# Patient Record
Sex: Female | Born: 1985 | Race: Black or African American | Hispanic: No | Marital: Single | Smoking: Never smoker
Health system: Southern US, Community
[De-identification: ages and names within clinical notes are randomized; demographics above are authoritative.]

## PROBLEM LIST (undated history)

## (undated) DIAGNOSIS — G43909 Migraine, unspecified, not intractable, without status migrainosus: Secondary | ICD-10-CM

## (undated) DIAGNOSIS — J189 Pneumonia, unspecified organism: Secondary | ICD-10-CM

## (undated) HISTORY — PX: NO PAST SURGERIES: SHX2092

## (undated) HISTORY — DX: Migraine, unspecified, not intractable, without status migrainosus: G43.909

---

## 2007-04-13 ENCOUNTER — Emergency Department (HOSPITAL_COMMUNITY): Admission: EM | Admit: 2007-04-13 | Discharge: 2007-04-13 | Payer: Self-pay | Admitting: Emergency Medicine

## 2011-07-03 LAB — URINALYSIS, ROUTINE W REFLEX MICROSCOPIC
Bilirubin Urine: NEGATIVE
Glucose, UA: NEGATIVE
Nitrite: NEGATIVE
Specific Gravity, Urine: >1.03 — ABNORMAL HIGH
pH: 6

## 2011-07-03 LAB — URINE MICROSCOPIC-ADD ON

## 2011-07-03 LAB — PREGNANCY, URINE: Preg Test, Ur: NEGATIVE

## 2014-11-30 ENCOUNTER — Emergency Department (HOSPITAL_COMMUNITY)
Admission: EM | Admit: 2014-11-30 | Discharge: 2014-11-30 | Disposition: A | Discharge status: Home / Self Care | Payer: Self-pay | Attending: Emergency Medicine | Admitting: Emergency Medicine

## 2014-11-30 ENCOUNTER — Emergency Department (HOSPITAL_COMMUNITY): Payer: Self-pay

## 2014-11-30 ENCOUNTER — Encounter (HOSPITAL_COMMUNITY): Payer: Self-pay | Admitting: *Deleted

## 2014-11-30 DIAGNOSIS — Z880 Allergy status to penicillin: Secondary | ICD-10-CM | POA: Insufficient documentation

## 2014-11-30 DIAGNOSIS — M791 Myalgia, unspecified site: Secondary | ICD-10-CM

## 2014-11-30 DIAGNOSIS — R1084 Generalized abdominal pain: Secondary | ICD-10-CM | POA: Insufficient documentation

## 2014-11-30 DIAGNOSIS — J159 Unspecified bacterial pneumonia: Secondary | ICD-10-CM | POA: Insufficient documentation

## 2014-11-30 DIAGNOSIS — Z79899 Other long term (current) drug therapy: Secondary | ICD-10-CM | POA: Insufficient documentation

## 2014-11-30 DIAGNOSIS — J189 Pneumonia, unspecified organism: Secondary | ICD-10-CM

## 2014-11-30 DIAGNOSIS — Z3202 Encounter for pregnancy test, result negative: Secondary | ICD-10-CM | POA: Insufficient documentation

## 2014-11-30 LAB — BASIC METABOLIC PANEL
ANION GAP: 9 (ref 5–15)
BUN: 7 mg/dL (ref 6–23)
CALCIUM: 9.1 mg/dL (ref 8.4–10.5)
CO2: 25 mmol/L (ref 19–32)
CREATININE: 0.68 mg/dL (ref 0.50–1.10)
Chloride: 103 mmol/L (ref 96–112)
Glucose, Bld: 97 mg/dL (ref 70–99)
Potassium: 3.9 mmol/L (ref 3.5–5.1)
SODIUM: 137 mmol/L (ref 135–145)

## 2014-11-30 LAB — URINALYSIS, ROUTINE W REFLEX MICROSCOPIC
BILIRUBIN URINE: NEGATIVE
Glucose, UA: NEGATIVE mg/dL
HGB URINE DIPSTICK: NEGATIVE
Ketones, ur: NEGATIVE mg/dL
Leukocytes, UA: NEGATIVE
Nitrite: NEGATIVE
PROTEIN: NEGATIVE mg/dL
SPECIFIC GRAVITY, URINE: 1.012 (ref 1.005–1.030)
UROBILINOGEN UA: 0.2 mg/dL (ref 0.0–1.0)
pH: 6.5 (ref 5.0–8.0)

## 2014-11-30 LAB — CBC WITH DIFFERENTIAL/PLATELET
Basophils Absolute: 0 10*3/uL (ref 0.0–0.1)
Basophils Relative: 0 % (ref 0–1)
EOS PCT: 1 % (ref 0–5)
Eosinophils Absolute: 0.1 10*3/uL (ref 0.0–0.7)
HCT: 36.6 % (ref 36.0–46.0)
Hemoglobin: 12.5 g/dL (ref 12.0–15.0)
LYMPHS ABS: 0.6 10*3/uL — AB (ref 0.7–4.0)
LYMPHS PCT: 4 % — AB (ref 12–46)
MCH: 31.1 pg (ref 26.0–34.0)
MCHC: 34.2 g/dL (ref 30.0–36.0)
MCV: 91 fL (ref 78.0–100.0)
MONOS PCT: 4 % (ref 3–12)
Monocytes Absolute: 0.6 10*3/uL (ref 0.1–1.0)
NEUTROS PCT: 91 % — AB (ref 43–77)
Neutro Abs: 13 10*3/uL — ABNORMAL HIGH (ref 1.7–7.7)
PLATELETS: 319 10*3/uL (ref 150–400)
RBC: 4.02 MIL/uL (ref 3.87–5.11)
RDW: 12.9 % (ref 11.5–15.5)
WBC: 14.3 10*3/uL — AB (ref 4.0–10.5)

## 2014-11-30 LAB — CK: CK TOTAL: 91 U/L (ref 7–177)

## 2014-11-30 LAB — PREGNANCY, URINE: PREG TEST UR: NEGATIVE

## 2014-11-30 MED ORDER — LEVOFLOXACIN 500 MG PO TABS
500.0000 mg | ORAL_TABLET | Freq: Once | ORAL | Status: AC
  Administered 2014-11-30: 500 mg via ORAL
  Filled 2014-11-30: qty 1

## 2014-11-30 MED ORDER — LEVOFLOXACIN 500 MG PO TABS: 500.0000 mg | ORAL_TABLET | Freq: Every day | ORAL | Status: AC

## 2014-11-30 MED ORDER — KETOROLAC TROMETHAMINE 30 MG/ML IJ SOLN
15.0000 mg | Freq: Once | INTRAMUSCULAR | Status: AC
  Administered 2014-11-30: 15 mg via INTRAVENOUS
  Filled 2014-11-30: qty 1

## 2014-11-30 MED ORDER — LEVOFLOXACIN IN D5W 500 MG/100ML IV SOLN
500.0000 mg | Freq: Once | INTRAVENOUS | Status: DC
  Administered 2014-11-30: 500 mg via INTRAVENOUS
  Filled 2014-11-30: qty 100

## 2014-11-30 MED ORDER — SODIUM CHLORIDE 0.9 % IV BOLUS (SEPSIS)
1000.0000 mL | Freq: Once | INTRAVENOUS | Status: AC
  Administered 2014-11-30: 1000 mL via INTRAVENOUS

## 2014-11-30 NOTE — ED Provider Notes (Signed)
Medical Screening Exam:  Pt with generalized body aches x weeks, nausea, urinary frequency, abnormal menstrual period (short and light) Feb 28-29.  Sweating/chills, cough, nasal congestion began yesterday.    Patient is A&Ox4, NAD, RRR with murmur, lungs CTAB, abd soft, tender throughout lower abdomen without guarding or rebound.   Pt stable to move out of fast track into main ED.  Labs, UA, upreg ordered.    Trixie Dredgemily Filemon Breton, PA-C 11/30/14 [id number]  Gerhard Munchobert Lockwood, MD 11/30/14 [phone]

## 2014-11-30 NOTE — ED Notes (Signed)
Pt reporting burning at the IV site while antibiotic being infused. MD made aware and reports we will do PO antibiotics and continue with discharge.

## 2014-11-30 NOTE — Discharge Instructions (Signed)
As discussed, it is very important to take all medication as directed, get plenty of rest, drink plenty of fluids, and be sure to follow-up with your physician.  Return here for concerning changes in your condition.

## 2014-11-30 NOTE — ED Notes (Signed)
Pt reports a 3 day Hx of muscle pain generalized with reported fever of 99 at home. Pt denies sore throat.

## 2014-11-30 NOTE — ED Provider Notes (Addendum)
CSN: [id number]     Arrival date & time 11/30/14  4098 History   First MD Initiated Contact with Patient 11/30/14 [phone]     Chief Complaint  Patient presents with  . Influenza     HPI  Patient presents with concern of cough, congestion, myalgia, fatigue.  Symptoms began about 2 weeks ago.  Initially there was myalgia, fatigue, polyuria. Over the past 2 days patient has developed chills, cough, congestion. No fever, confusion, disorientation. No nausea, vomiting, diarrhea. No dysuria. No focal pain or No sore throat, no dysphagia, no headache. No relief with OTC medication.   History reviewed. No pertinent past medical history. History reviewed. No pertinent past surgical history. History reviewed. No pertinent family history. History  Substance Use Topics  . Smoking status: Never Smoker   . Smokeless tobacco: Never Used  . Alcohol Use: No   OB History    No data available     Review of Systems  Constitutional:       Per HPI, otherwise negative  HENT:       Per HPI, otherwise negative  Respiratory:       Per HPI, otherwise negative  Cardiovascular:       Per HPI, otherwise negative  Gastrointestinal: Negative for vomiting.  Endocrine:       Negative aside from HPI  Genitourinary:       Neg aside from HPI   Musculoskeletal:       Per HPI, otherwise negative  Skin: Negative.   Neurological: Negative for syncope.      Allergies  Other and Penicillins  Home Medications   Prior to Admission medications   Medication Sig Start Date End Date Taking? Authorizing Provider  acetaminophen (TYLENOL) 650 MG CR tablet Take 650 mg by mouth every 8 (eight) hours as needed for fever.   Yes Historical Provider, MD  Multiple Vitamins-Minerals (WOMENS MULTI VITAMIN & MINERAL PO) Take 1 tablet by mouth daily.   Yes Historical Provider, MD  Pseudoephedrine HCl (NASAL DECONGESTANT PO) Take 1 tablet by mouth daily as needed (cold, congestion).   Yes Historical Provider, MD    BP 119/56 mmHg  Pulse 82  Temp(Src) 98.3 F (36.8 C) (Oral)  Resp 18  SpO2 100%  LMP 11/15/2014 Physical Exam  Constitutional: She is oriented to person, place, and time. She appears well-developed and well-nourished. No distress.  HENT:  Head: Normocephalic and atraumatic.  Eyes: Conjunctivae and EOM are normal.  Cardiovascular: Normal rate and regular rhythm.   Pulmonary/Chest: Effort normal and breath sounds normal. No stridor. No respiratory distress.  Abdominal: She exhibits no distension.  Minimal diffuse tenderness, without focal pain  Musculoskeletal: She exhibits no edema.  Neurological: She is alert and oriented to person, place, and time. No cranial nerve deficit.  Skin: Skin is warm and dry.  Psychiatric: She has a normal mood and affect.  Nursing note and vitals reviewed.   ED Course  Procedures (including critical care time) Labs Review Labs Reviewed  CBC WITH DIFFERENTIAL/PLATELET - Abnormal; Notable for the following:    WBC 14.3 (*)    Neutrophils Relative % 91 (*)    Neutro Abs 13.0 (*)    Lymphocytes Relative 4 (*)    Lymphs Abs 0.6 (*)    All other components within normal limits  BASIC METABOLIC PANEL  URINALYSIS, ROUTINE W REFLEX MICROSCOPIC  PREGNANCY, URINE  CK    Imaging Review Dg Chest 2 View  11/30/2014   CLINICAL DATA:  Fever and  cough.  EXAM: CHEST  2 VIEW  COMPARISON:  05/25/2011  FINDINGS: Questionable reticular nodular airspace opacity in the left perihilar lung, only visible in the frontal projection. No edema, effusion, or pneumothorax. Normal heart size and aortic contours.  IMPRESSION: Possible bronchopneumonia on the left.   Electronically Signed   By: Marnee SpringJonathon  Watts M.D.   On: 11/30/2014 10:55   On repeat exam the patient is awake and alert, in no distress. I made her aware of the evidence for pneumonia.  12:45 PM Patient had arm burning w IV Levaquin. PO ordered MDM   Final diagnoses:  Myalgia   pneumonia  Patient  presents with ongoing respiratory infection, now with increasing myalgia, fever. Patient is awake and alert, hemodynamically stable. No evidence from when coronary ischemia, or sepsis. However, there is evidence for pneumonia. Patient was started on antibiotics, tolerated the first dose well, was discharged in stable condition with a course of medication for home.     Gerhard Munchobert Caylea Foronda, MD 11/30/14 1156  Gerhard Munchobert Antavia Tandy, MD 11/30/14 [phone]

## 2014-11-30 NOTE — ED Notes (Signed)
CK ADDED TO BLOOD IN LAB.

## 2015-02-19 ENCOUNTER — Emergency Department (HOSPITAL_COMMUNITY): Payer: No Typology Code available for payment source

## 2015-02-19 ENCOUNTER — Emergency Department (HOSPITAL_COMMUNITY)
Admission: EM | Admit: 2015-02-19 | Discharge: 2015-02-19 | Disposition: A | Discharge status: Home / Self Care | Payer: No Typology Code available for payment source | Attending: Emergency Medicine | Admitting: Emergency Medicine

## 2015-02-19 ENCOUNTER — Encounter (HOSPITAL_COMMUNITY): Payer: Self-pay | Admitting: Emergency Medicine

## 2015-02-19 DIAGNOSIS — S199XXA Unspecified injury of neck, initial encounter: Secondary | ICD-10-CM | POA: Insufficient documentation

## 2015-02-19 DIAGNOSIS — Y9389 Activity, other specified: Secondary | ICD-10-CM | POA: Diagnosis not present

## 2015-02-19 DIAGNOSIS — Z880 Allergy status to penicillin: Secondary | ICD-10-CM | POA: Insufficient documentation

## 2015-02-19 DIAGNOSIS — S3992XA Unspecified injury of lower back, initial encounter: Secondary | ICD-10-CM | POA: Insufficient documentation

## 2015-02-19 DIAGNOSIS — T148 Other injury of unspecified body region: Secondary | ICD-10-CM | POA: Diagnosis not present

## 2015-02-19 DIAGNOSIS — S8992XA Unspecified injury of left lower leg, initial encounter: Secondary | ICD-10-CM | POA: Insufficient documentation

## 2015-02-19 DIAGNOSIS — S8991XA Unspecified injury of right lower leg, initial encounter: Secondary | ICD-10-CM | POA: Insufficient documentation

## 2015-02-19 DIAGNOSIS — Y92410 Unspecified street and highway as the place of occurrence of the external cause: Secondary | ICD-10-CM | POA: Insufficient documentation

## 2015-02-19 DIAGNOSIS — Y998 Other external cause status: Secondary | ICD-10-CM | POA: Diagnosis not present

## 2015-02-19 MED ORDER — CYCLOBENZAPRINE HCL 5 MG PO TABS: 5.0000 mg | ORAL_TABLET | Freq: Three times a day (TID) | ORAL | Status: DC | PRN

## 2015-02-19 MED ORDER — NAPROXEN 500 MG PO TABS: 500.0000 mg | ORAL_TABLET | Freq: Two times a day (BID) | ORAL | Status: DC
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
## 2015-02-19 NOTE — ED Notes (Signed)
Dr.Knapp at bedside  

## 2015-02-19 NOTE — Discharge Instructions (Signed)
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
## 2015-07-10 ENCOUNTER — Encounter (HOSPITAL_COMMUNITY): Payer: Self-pay | Admitting: *Deleted

## 2015-07-10 ENCOUNTER — Emergency Department (HOSPITAL_COMMUNITY)
Admission: EM | Admit: 2015-07-10 | Discharge: 2015-07-10 | Disposition: A | Discharge status: Home / Self Care | Payer: BLUE CROSS/BLUE SHIELD | Attending: Emergency Medicine | Admitting: Emergency Medicine

## 2015-07-10 ENCOUNTER — Emergency Department (HOSPITAL_COMMUNITY): Payer: BLUE CROSS/BLUE SHIELD

## 2015-07-10 DIAGNOSIS — Y9389 Activity, other specified: Secondary | ICD-10-CM | POA: Diagnosis not present

## 2015-07-10 DIAGNOSIS — Y998 Other external cause status: Secondary | ICD-10-CM | POA: Diagnosis not present

## 2015-07-10 DIAGNOSIS — Z91040 Latex allergy status: Secondary | ICD-10-CM | POA: Insufficient documentation

## 2015-07-10 DIAGNOSIS — Z3202 Encounter for pregnancy test, result negative: Secondary | ICD-10-CM | POA: Insufficient documentation

## 2015-07-10 DIAGNOSIS — Z880 Allergy status to penicillin: Secondary | ICD-10-CM | POA: Diagnosis not present

## 2015-07-10 DIAGNOSIS — S301XXA Contusion of abdominal wall, initial encounter: Secondary | ICD-10-CM | POA: Diagnosis not present

## 2015-07-10 DIAGNOSIS — S20212A Contusion of left front wall of thorax, initial encounter: Secondary | ICD-10-CM | POA: Insufficient documentation

## 2015-07-10 DIAGNOSIS — S40021A Contusion of right upper arm, initial encounter: Secondary | ICD-10-CM | POA: Insufficient documentation

## 2015-07-10 DIAGNOSIS — S2232XA Fracture of one rib, left side, initial encounter for closed fracture: Secondary | ICD-10-CM | POA: Diagnosis not present

## 2015-07-10 DIAGNOSIS — S40022A Contusion of left upper arm, initial encounter: Secondary | ICD-10-CM | POA: Insufficient documentation

## 2015-07-10 DIAGNOSIS — Y92008 Other place in unspecified non-institutional (private) residence as the place of occurrence of the external cause: Secondary | ICD-10-CM | POA: Diagnosis not present

## 2015-07-10 DIAGNOSIS — Z791 Long term (current) use of non-steroidal anti-inflammatories (NSAID): Secondary | ICD-10-CM | POA: Diagnosis not present

## 2015-07-10 DIAGNOSIS — S299XXA Unspecified injury of thorax, initial encounter: Secondary | ICD-10-CM | POA: Diagnosis present

## 2015-07-10 DIAGNOSIS — W01198A Fall on same level from slipping, tripping and stumbling with subsequent striking against other object, initial encounter: Secondary | ICD-10-CM | POA: Insufficient documentation

## 2015-07-10 LAB — PREGNANCY, URINE: PREG TEST UR: NEGATIVE

## 2015-07-10 LAB — URINALYSIS, ROUTINE W REFLEX MICROSCOPIC
Bilirubin Urine: NEGATIVE
GLUCOSE, UA: NEGATIVE mg/dL
Ketones, ur: 15 mg/dL — AB
Leukocytes, UA: NEGATIVE
Nitrite: NEGATIVE
Protein, ur: 30 mg/dL — AB
SPECIFIC GRAVITY, URINE: 1.01 (ref 1.005–1.030)
Urobilinogen, UA: 0.2 mg/dL (ref 0.0–1.0)
pH: 7 (ref 5.0–8.0)

## 2015-07-10 LAB — URINE MICROSCOPIC-ADD ON

## 2015-07-10 MED ORDER — IBUPROFEN 800 MG PO TABS
800.0000 mg | ORAL_TABLET | Freq: Once | ORAL | Status: AC
  Administered 2015-07-10: 800 mg via ORAL
  Filled 2015-07-10: qty 1

## 2015-07-10 MED ORDER — HYDROCODONE-ACETAMINOPHEN 5-325 MG PO TABS: 1.0000 | ORAL_TABLET | ORAL | Status: DC | PRN

## 2015-07-10 MED ORDER — IBUPROFEN 800 MG PO TABS: 800.0000 mg | ORAL_TABLET | Freq: Three times a day (TID) | ORAL | Status: DC

## 2015-07-10 NOTE — Discharge Instructions (Signed)
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
## 2015-10-07 ENCOUNTER — Other Ambulatory Visit (HOSPITAL_COMMUNITY): Payer: Self-pay | Admitting: Nurse Practitioner

## 2015-10-07 DIAGNOSIS — R1032 Left lower quadrant pain: Secondary | ICD-10-CM

## 2015-10-11 ENCOUNTER — Ambulatory Visit (HOSPITAL_COMMUNITY)
Admission: RE | Admit: 2015-10-11 | Discharge: 2015-10-11 | Disposition: A | Discharge status: Home / Self Care | Payer: BLUE CROSS/BLUE SHIELD | Source: Ambulatory Visit | Attending: Nurse Practitioner | Admitting: Nurse Practitioner

## 2015-10-11 ENCOUNTER — Encounter (HOSPITAL_COMMUNITY): Payer: Self-pay

## 2015-10-11 DIAGNOSIS — R1032 Left lower quadrant pain: Secondary | ICD-10-CM | POA: Diagnosis present

## 2015-10-11 DIAGNOSIS — N200 Calculus of kidney: Secondary | ICD-10-CM | POA: Diagnosis not present

## 2015-10-11 DIAGNOSIS — K760 Fatty (change of) liver, not elsewhere classified: Secondary | ICD-10-CM | POA: Insufficient documentation

## 2015-10-11 MED ORDER — IOHEXOL 300 MG/ML  SOLN
100.0000 mL | Freq: Once | INTRAMUSCULAR | Status: AC | PRN
  Administered 2015-10-11: 100 mL via INTRAVENOUS

## 2015-10-11 MED ORDER — DIPHENHYDRAMINE HCL 25 MG PO CAPS
ORAL_CAPSULE | ORAL | Status: AC
  Filled 2015-10-11: qty 2

## 2015-10-11 MED ORDER — DIPHENHYDRAMINE HCL 50 MG PO CAPS
50.0000 mg | ORAL_CAPSULE | Freq: Once | ORAL | Status: AC
  Administered 2015-10-11: 50 mg via ORAL
  Filled 2015-10-11: qty 1
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
## 2016-01-16 ENCOUNTER — Emergency Department (HOSPITAL_COMMUNITY)
Admission: EM | Admit: 2016-01-16 | Discharge: 2016-01-16 | Disposition: A | Discharge status: Home / Self Care | Payer: BLUE CROSS/BLUE SHIELD | Attending: Emergency Medicine | Admitting: Emergency Medicine

## 2016-01-16 ENCOUNTER — Encounter (HOSPITAL_COMMUNITY): Payer: Self-pay | Admitting: *Deleted

## 2016-01-16 ENCOUNTER — Emergency Department (HOSPITAL_COMMUNITY): Payer: BLUE CROSS/BLUE SHIELD

## 2016-01-16 DIAGNOSIS — S0083XA Contusion of other part of head, initial encounter: Secondary | ICD-10-CM | POA: Diagnosis not present

## 2016-01-16 DIAGNOSIS — S0990XA Unspecified injury of head, initial encounter: Secondary | ICD-10-CM

## 2016-01-16 DIAGNOSIS — T07XXXA Unspecified multiple injuries, initial encounter: Secondary | ICD-10-CM

## 2016-01-16 DIAGNOSIS — Y92009 Unspecified place in unspecified non-institutional (private) residence as the place of occurrence of the external cause: Secondary | ICD-10-CM | POA: Diagnosis not present

## 2016-01-16 DIAGNOSIS — Y9389 Activity, other specified: Secondary | ICD-10-CM | POA: Diagnosis not present

## 2016-01-16 DIAGNOSIS — Y999 Unspecified external cause status: Secondary | ICD-10-CM | POA: Insufficient documentation

## 2016-01-16 DIAGNOSIS — S0003XA Contusion of scalp, initial encounter: Secondary | ICD-10-CM | POA: Diagnosis not present

## 2016-01-16 LAB — I-STAT CHEM 8, ED
BUN: 10 mg/dL (ref 6–20)
Calcium, Ion: 1.14 mmol/L (ref 1.12–1.23)
Chloride: 102 mmol/L (ref 101–111)
Creatinine, Ser: 0.8 mg/dL (ref 0.44–1.00)
Glucose, Bld: 84 mg/dL (ref 65–99)
HEMATOCRIT: 40 % (ref 36.0–46.0)
HEMOGLOBIN: 13.6 g/dL (ref 12.0–15.0)
POTASSIUM: 3.6 mmol/L (ref 3.5–5.1)
Sodium: 139 mmol/L (ref 135–145)
TCO2: 23 mmol/L (ref 0–100)

## 2016-01-16 LAB — I-STAT BETA HCG BLOOD, ED (MC, WL, AP ONLY)

## 2016-01-16 MED ORDER — HYDROCODONE-ACETAMINOPHEN 5-325 MG PO TABS
2.0000 | ORAL_TABLET | Freq: Once | ORAL | Status: AC
  Administered 2016-01-16: 2 via ORAL
  Filled 2016-01-16: qty 2

## 2016-01-16 MED ORDER — HYDROCODONE-ACETAMINOPHEN 5-325 MG PO TABS: 2.0000 | ORAL_TABLET | ORAL | Status: DC | PRN

## 2016-01-16 MED ORDER — IBUPROFEN 800 MG PO TABS: 800.0000 mg | ORAL_TABLET | Freq: Three times a day (TID) | ORAL | Status: DC
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
## 2016-01-16 NOTE — Discharge Instructions (Signed)
Please obtain all of your results from medical records or have your doctors office obtain the results - share them with your doctor - you should be seen at your doctors office in the next 2 days. Call today to arrange your follow up. Take the medications as prescribed. Please review all of the medicines and only take them if you do not have an allergy to them. Please be aware that if you are taking birth control pills, taking other prescriptions, ESPECIALLY ANTIBIOTICS may make the birth control ineffective - if this is the case, either do not engage in sexual activity or use alternative methods of birth control such as condoms until you have finished the medicine and your family doctor says it is OK to restart them. If you are on a blood thinner such as COUMADIN, be aware that any other medicine that you take may cause the coumadin to either work too much, or not enough - you should have your coumadin level rechecked in next 7 days if this is the case.  ?  It is also a possibility that you have an allergic reaction to any of the medicines that you have been prescribed - Everybody reacts differently to medications and while MOST people have no trouble with most medicines, you may have a reaction such as nausea, vomiting, rash, swelling, shortness of breath. If this is the case, please stop taking the medicine immediately and contact your physician.  ?  You should return to the ER if you develop severe or worsening symptoms.    Concussion, Adult A concussion, or closed-head injury, is a brain injury caused by a direct blow to the head or by a quick and sudden movement (jolt) of the head or neck. Concussions are usually not life-threatening. Even so, the effects of a concussion can be serious. If you have had a concussion before, you are more likely to experience concussion-like symptoms after a direct blow to the head.  CAUSES  Direct blow to the head, such as from running into another player during a  soccer game, being hit in a fight, or hitting your head on a hard surface.  A jolt of the head or neck that causes the brain to move back and forth inside the skull, such as in a car crash. SIGNS AND SYMPTOMS The signs of a concussion can be hard to notice. Early on, they may be missed by you, family members, and health care providers. You may look fine but act or feel differently. Symptoms are usually temporary, but they may last for days, weeks, or even longer. Some symptoms may appear right away while others may not show up for hours or days. Every head injury is different. Symptoms include:  Mild to moderate headaches that will not go away.  A feeling of pressure inside your head.  Having more trouble than usual:  Learning or remembering things you have heard.  Answering questions.  Paying attention or concentrating.  Organizing daily tasks.  Making decisions and solving problems.  Slowness in thinking, acting or reacting, speaking, or reading.  Getting lost or being easily confused.  Feeling tired all the time or lacking energy (fatigued).  Feeling drowsy.  Sleep disturbances.  Sleeping more than usual.  Sleeping less than usual.  Trouble falling asleep.  Trouble sleeping (insomnia).  Loss of balance or feeling lightheaded or dizzy.  Nausea or vomiting.  Numbness or tingling.  Increased sensitivity to:  Sounds.  Lights.  Distractions.  Vision problems or eyes  that tire easily.  Diminished sense of taste or smell.  Ringing in the ears.  Mood changes such as feeling sad or anxious.  Becoming easily irritated or angry for little or no reason.  Lack of motivation.  Seeing or hearing things other people do not see or hear (hallucinations). DIAGNOSIS Your health care provider can usually diagnose a concussion based on a description of your injury and symptoms. He or she will ask whether you passed out (lost consciousness) and whether you are having  trouble remembering events that happened right before and during your injury. Your evaluation might include:  A brain scan to look for signs of injury to the brain. Even if the test shows no injury, you may still have a concussion.  Blood tests to be sure other problems are not present. TREATMENT  Concussions are usually treated in an emergency department, in urgent care, or at a clinic. You may need to stay in the hospital overnight for further treatment.  Tell your health care provider if you are taking any medicines, including prescription medicines, over-the-counter medicines, and natural remedies. Some medicines, such as blood thinners (anticoagulants) and aspirin, may increase the chance of complications. Also tell your health care provider whether you have had alcohol or are taking illegal drugs. This information may affect treatment.  Your health care provider will send you home with important instructions to follow.  How fast you will recover from a concussion depends on many factors. These factors include how severe your concussion is, what part of your brain was injured, your age, and how healthy you were before the concussion.  Most people with mild injuries recover fully. Recovery can take time. In general, recovery is slower in older persons. Also, persons who have had a concussion in the past or have other medical problems may find that it takes longer to recover from their current injury. HOME CARE INSTRUCTIONS General Instructions  Carefully follow the directions your health care provider gave you.  Only take over-the-counter or prescription medicines for pain, discomfort, or fever as directed by your health care provider.  Take only those medicines that your health care provider has approved.  Do not drink alcohol until your health care provider says you are well enough to do so. Alcohol and certain other drugs may slow your recovery and can put you at risk of further  injury.  If it is harder than usual to remember things, write them down.  If you are easily distracted, try to do one thing at a time. For example, do not try to watch TV while fixing dinner.  Talk with family members or close friends when making important decisions.  Keep all follow-up appointments. Repeated evaluation of your symptoms is recommended for your recovery.  Watch your symptoms and tell others to do the same. Complications sometimes occur after a concussion. Older adults with a brain injury may have a higher risk of serious complications, such as a blood clot on the brain.  Tell your teachers, school nurse, school counselor, coach, athletic trainer, or work Production designer, theatre/television/film about your injury, symptoms, and restrictions. Tell them about what you can or cannot do. They should watch for:  Increased problems with attention or concentration.  Increased difficulty remembering or learning new information.  Increased time needed to complete tasks or assignments.  Increased irritability or decreased ability to cope with stress.  Increased symptoms.  Rest. Rest helps the brain to heal. Make sure you:  Get plenty of sleep at night.  Avoid staying up late at night.  Keep the same bedtime hours on weekends and weekdays.  Rest during the day. Take daytime naps or rest breaks when you feel tired.  Limit activities that require a lot of thought or concentration. These include:  Doing homework or job-related work.  Watching TV.  Working on the computer.  Avoid any situation where there is potential for another head injury (football, hockey, soccer, basketball, martial arts, downhill snow sports and horseback riding). Your condition will get worse every time you experience a concussion. You should avoid these activities until you are evaluated by the appropriate follow-up health care providers. Returning To Your Regular Activities You will need to return to your normal activities slowly,  not all at once. You must give your body and brain enough time for recovery.  Do not return to sports or other athletic activities until your health care provider tells you it is safe to do so.  Ask your health care provider when you can drive, ride a bicycle, or operate heavy machinery. Your ability to react may be slower after a brain injury. Never do these activities if you are dizzy.  Ask your health care provider about when you can return to work or school. Preventing Another Concussion It is very important to avoid another brain injury, especially before you have recovered. In rare cases, another injury can lead to permanent brain damage, brain swelling, or death. The risk of this is greatest during the first 7-10 days after a head injury. Avoid injuries by:  Wearing a seat belt when riding in a car.  Drinking alcohol only in moderation.  Wearing a helmet when biking, skiing, skateboarding, skating, or doing similar activities.  Avoiding activities that could lead to a second concussion, such as contact or recreational sports, until your health care provider says it is okay.  Taking safety measures in your home.  Remove clutter and tripping hazards from floors and stairways.  Use grab bars in bathrooms and handrails by stairs.  Place non-slip mats on floors and in bathtubs.  Improve lighting in dim areas. SEEK MEDICAL CARE IF:  You have increased problems paying attention or concentrating.  You have increased difficulty remembering or learning new information.  You need more time to complete tasks or assignments than before.  You have increased irritability or decreased ability to cope with stress.  You have more symptoms than before. Seek medical care if you have any of the following symptoms for more than 2 weeks after your injury:  Lasting (chronic) headaches.  Dizziness or balance problems.  Nausea.  Vision problems.  Increased sensitivity to noise or  light.  Depression or mood swings.  Anxiety or irritability.  Memory problems.  Difficulty concentrating or paying attention.  Sleep problems.  Feeling tired all the time. SEEK IMMEDIATE MEDICAL CARE IF:  You have severe or worsening headaches. These may be a sign of a blood clot in the brain.  You have weakness (even if only in one hand, leg, or part of the face).  You have numbness.  You have decreased coordination.  You vomit repeatedly.  You have increased sleepiness.  One pupil is larger than the other.  You have convulsions.  You have slurred speech.  You have increased confusion. This may be a sign of a blood clot in the brain.  You have increased restlessness, agitation, or irritability.  You are unable to recognize people or places.  You have neck pain.  It is difficult  to wake you up.  You have unusual behavior changes.  You lose consciousness. MAKE SURE YOU:  Understand these instructions.  Will watch your condition.  Will get help right away if you are not doing well or get worse.   This information is not intended to replace advice given to you by your health care provider. Make sure you discuss any questions you have with your health care provider.   Document Released: 11/25/2003 Document Revised: 09/25/2014 Document Reviewed: 03/27/2013 Elsevier Interactive Patient Education [employer]

## 2016-01-16 NOTE — ED Notes (Signed)
Patient with no complaints at this time. Respirations even and unlabored. Skin warm/dry. Discharge instructions reviewed with patient at this time. Patient given opportunity to voice concerns/ask questions. Patient discharged at this time and left Emergency Department with steady gait.   
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
## 2017-11-03 ENCOUNTER — Emergency Department (HOSPITAL_COMMUNITY): Payer: Self-pay

## 2017-11-03 ENCOUNTER — Emergency Department (HOSPITAL_COMMUNITY)
Admission: EM | Admit: 2017-11-03 | Discharge: 2017-11-03 | Disposition: A | Discharge status: Home / Self Care | Payer: Self-pay | Attending: Emergency Medicine | Admitting: Emergency Medicine

## 2017-11-03 ENCOUNTER — Other Ambulatory Visit: Payer: Self-pay

## 2017-11-03 ENCOUNTER — Encounter (HOSPITAL_COMMUNITY): Payer: Self-pay | Admitting: *Deleted

## 2017-11-03 DIAGNOSIS — J111 Influenza due to unidentified influenza virus with other respiratory manifestations: Secondary | ICD-10-CM | POA: Insufficient documentation

## 2017-11-03 DIAGNOSIS — Z91040 Latex allergy status: Secondary | ICD-10-CM | POA: Insufficient documentation

## 2017-11-03 DIAGNOSIS — R69 Illness, unspecified: Secondary | ICD-10-CM

## 2017-11-03 HISTORY — DX: Pneumonia, unspecified organism: J18.9

## 2017-11-03 NOTE — ED Provider Notes (Signed)
[place] EMERGENCY DEPARTMENT Provider Note   CSN: [id number] Arrival date & time: 11/03/17  [id number]     History   Chief Complaint Chief Complaint  Patient presents with  . Cough    HPI Alexandra Shannon is a 32 y.o. female.  The history is provided by the patient.  Cough  This is a new problem. The current episode started 12 to 24 hours ago. The problem occurs every few minutes. The problem has been gradually worsening. The cough is productive of sputum. Associated symptoms include chills, sweats, sore throat, myalgias and shortness of breath. She has tried cough syrup for the symptoms. The treatment provided no relief. She is a smoker.   Reports onset of cough/sore throat/myalgias yesterday.  She also reports chills and waking up with sweats.  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  Past Medical History:  Diagnosis Date  . Pneumonia     There are no active problems to display for this patient.   History reviewed. No pertinent surgical history.  OB History    No data available       Home Medications    Prior to Admission medications   Medication Sig Start Date End Date Taking? Authorizing Provider  HYDROcodone-acetaminophen (NORCO/VICODIN) 5-325 MG tablet Take 2 tablets by mouth every 4 (four) hours as needed. 01/16/16   Eber HongMiller, Brian, MD  ibuprofen (ADVIL,MOTRIN) 800 MG tablet Take 1 tablet (800 mg total) by mouth 3 (three) times daily. 01/16/16   Eber HongMiller, Brian, MD    Family History History reviewed. No pertinent family history.  Social History Social History   Tobacco Use  . Smoking status: Never Smoker  . Smokeless tobacco: Never Used  Substance Use Topics  . Alcohol use: No    Frequency: Never  . Drug use: No     Allergies   Contrast media [iodinated diagnostic agents]; Other; Penicillins; and Latex   Review of Systems Review of Systems  Constitutional:  Positive for chills.  HENT: Positive for sore throat.   Respiratory: Positive for cough and shortness of breath.   Gastrointestinal: Negative for vomiting.  Musculoskeletal: Positive for myalgias.  All other systems reviewed and are negative.    Physical Exam Updated Vital Signs BP 127/80 (BP Location: Right Arm)   Pulse 78   Temp 98.4 F (36.9 C) (Oral)   Resp 18   Ht 1.575 m (5\' 2" )   Wt 73.9 kg (163 lb)   LMP 09/08/2017   SpO2 100%   BMI 29.81 kg/m   Physical Exam CONSTITUTIONAL: Well developed/well nourished HEAD: Normocephalic/atraumatic EYES: EOMI/PERRL ENMT: Mucous membranes moist, no erythema or exudates. NECK: supple no meningeal signs SPINE/BACK:entire spine nontender CV: S1/S2 noted, no murmurs/rubs/gallops noted LUNGS: Lungs are clear to auscultation bilaterally, no apparent distress ABDOMEN: soft, nontender NEURO: Pt is awake/alert/appropriate, moves all extremitiesx4.  No facial droop.   EXTREMITIES: pulses normal/equal, full ROM SKIN: warm, color normal PSYCH: no abnormalities of mood noted, alert and oriented to situation   ED Treatments / Results  Labs (all labs ordered are listed, but only abnormal results are displayed) Labs Reviewed - No data to display  EKG  EKG Interpretation None       Radiology Dg Chest 2 View  Result Date: 11/03/2017 CLINICAL DATA:  Cough. EXAM: CHEST  2 VIEW COMPARISON:  07/10/2015 FINDINGS: The cardiomediastinal contours are normal. Mild bronchial thickening. Pulmonary vasculature is normal. No  consolidation, pleural effusion, or pneumothorax. No acute osseous abnormalities are seen. IMPRESSION: Mild bronchial thickening. Electronically Signed   By: Rubye Oaks M.D.   On: 11/03/2017 05:43    Procedures Procedures (including critical care time)  Medications Ordered in ED Medications - No data to display   Initial Impression / Assessment and Plan / ED Course  I have reviewed the triage vital signs and the  nursing notes.  Pertinent imaging results that were available during my care of the patient were reviewed by me and considered in my medical decision making (see chart for details).     Patient With probable influenza.  Her vital signs are appropriate, no hypoxia no distress noted.  Her chest x-ray is negative.  I have offered her Tamiflu, and after discussion she will decline.  This seems reasonable as she is very low risk for any complications.  We discussed strict return precautions  Final Clinical Impressions(s) / ED Diagnoses   Final diagnoses:  Influenza-like illness    ED Discharge Orders    None       Zadie Rhine, MD 11/03/17 [phone]

## 2017-11-03 NOTE — ED Triage Notes (Addendum)
Pt reports dry cough, chest soreness, scratchy throat, chills, and body aches since yesterday morning. Pt states she feels like she did when she had pneumonia in 2016.

## 2018-09-10 IMAGING — DX DG CHEST 2V
2 series · 2 of 2 positions shown · non-contrast
Comparison: 07/10/2015

CLINICAL DATA: Cough.

EXAM:
CHEST  2 VIEW

[chest pa]
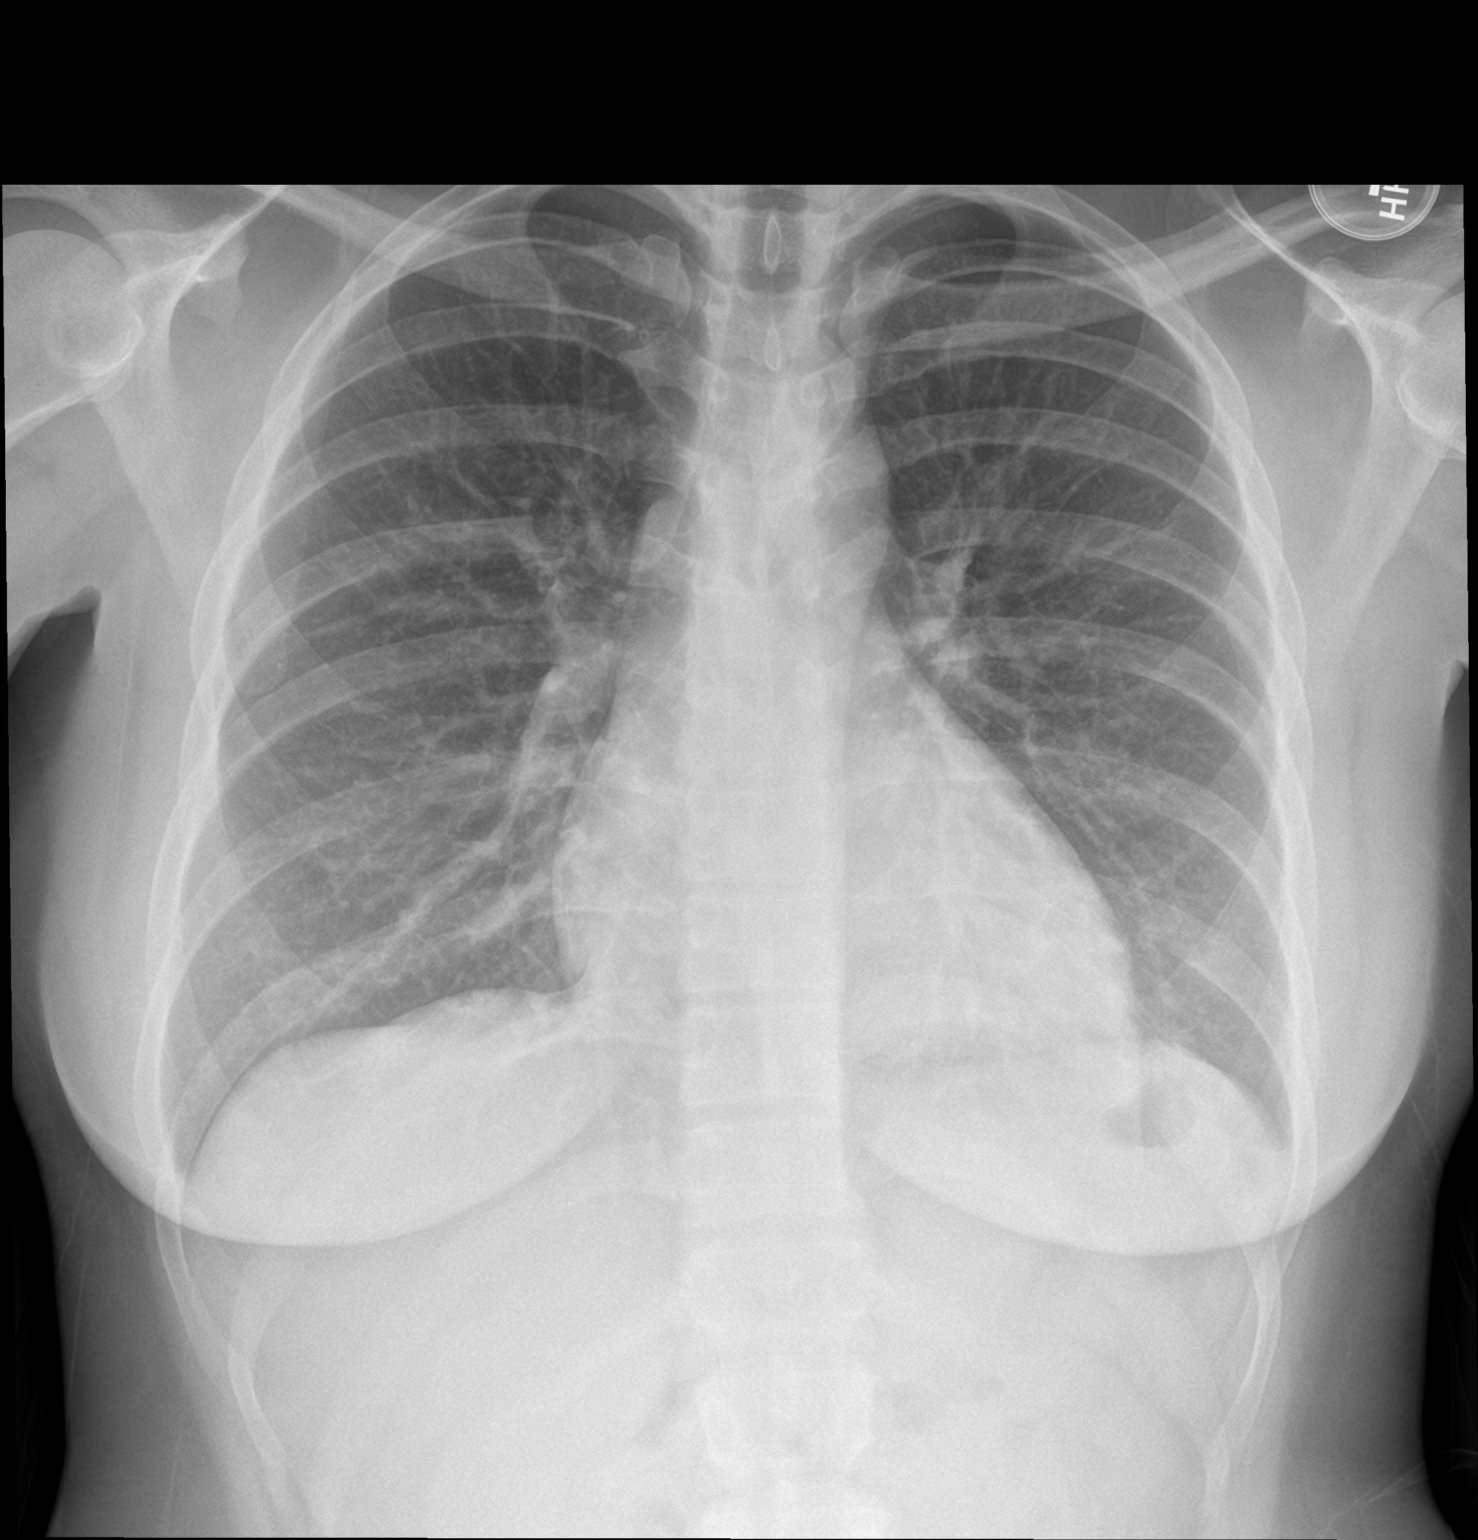

[chest lat]
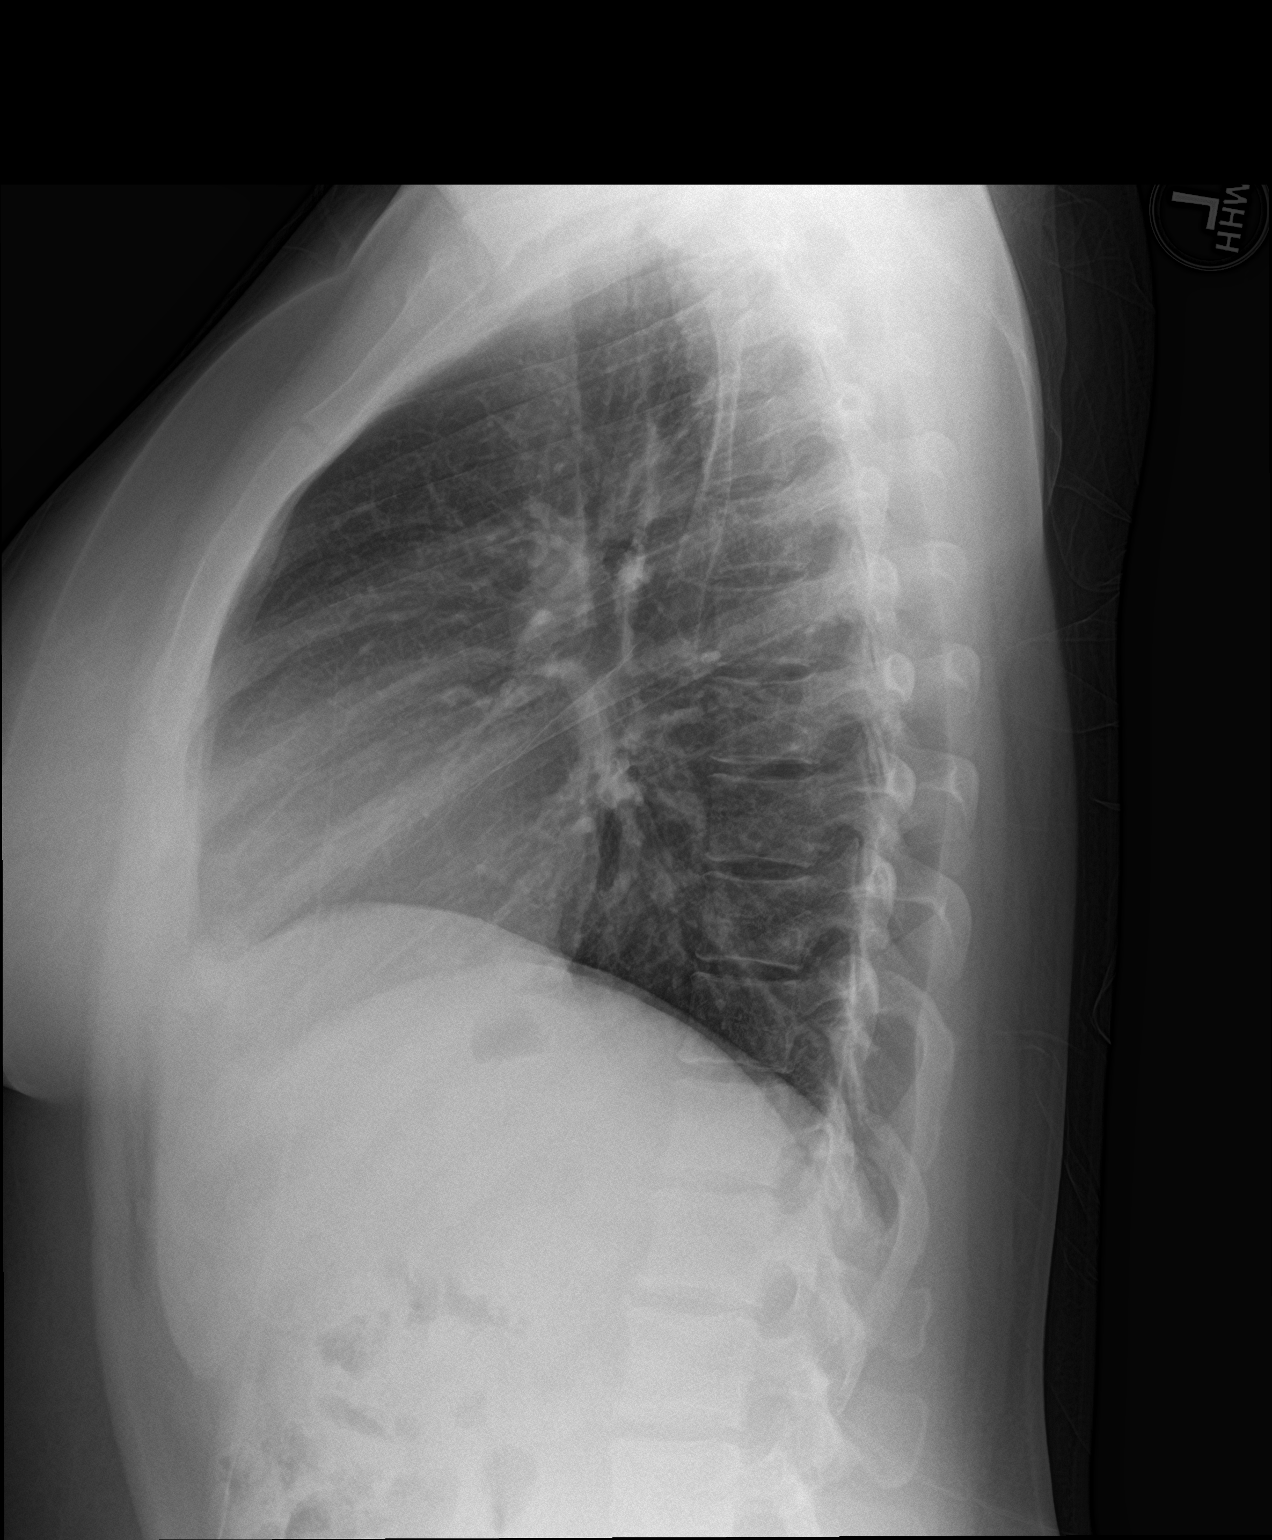

[2 of 2 positions shown; findings below may reference images not displayed]

FINDINGS: The cardiomediastinal contours are normal. Mild bronchial
thickening. Pulmonary vasculature is normal. No consolidation,
pleural effusion, or pneumothorax. No acute osseous abnormalities
are seen.
IMPRESSION: Mild bronchial thickening.

## 2020-01-12 ENCOUNTER — Encounter: Payer: Self-pay | Admitting: Neurology

## 2020-01-12 ENCOUNTER — Ambulatory Visit: Payer: 59 | Admitting: Neurology

## 2020-01-12 ENCOUNTER — Other Ambulatory Visit: Payer: Self-pay

## 2020-01-12 VITALS — BP 145/92 | HR 77 | Temp 97.5°F | Ht 63.0 in | Wt 202.8 lb

## 2020-01-12 DIAGNOSIS — G43709 Chronic migraine without aura, not intractable, without status migrainosus: Secondary | ICD-10-CM | POA: Diagnosis not present

## 2020-01-12 DIAGNOSIS — IMO0002 Reserved for concepts with insufficient information to code with codable children: Secondary | ICD-10-CM

## 2020-01-12 MED ORDER — ONDANSETRON 4 MG PO TBDP: 4.0000 mg | ORAL_TABLET | Freq: Three times a day (TID) | ORAL | 6 refills | Status: AC | PRN

## 2020-01-12 MED ORDER — TOPIRAMATE 50 MG PO TABS: 50.0000 mg | ORAL_TABLET | Freq: Two times a day (BID) | ORAL | 4 refills | Status: DC

## 2020-01-12 MED ORDER — RIZATRIPTAN BENZOATE 5 MG PO TBDP
5.0000 mg | ORAL_TABLET | ORAL | 12 refills | Status: AC | PRN
Start: 2020-01-12 — End: ?

## 2020-01-12 NOTE — Progress Notes (Signed)
PATIENT: Alexandra Shannon DOB: Jan 17, 1986  Chief Complaint  Patient presents with  . New Patient (Initial Visit)    EMG room 4, alone. Paper referral from Orvilla Cornwall (PCP) for migraines.   [name] PCP    [facility] Cornwall  . Migraine    Had migraines as a teen and then went away. Recently came back with N/V and auras. She was placed on topamax 25mg  po BID about a month ago which has helped.      HISTORICAL  Alexandra Shannon is a 34 year old female, seen in request by her primary care physician Dr. [id number] for evaluation of chronic migraine headache, initial evaluation was on January 12, 2020.  I have reviewed and summarized the referring note from the referring physician.  She reported long history of migraine since teenager, her typical migraine are lateralized, usually on the left side, preceded by left eye twitching, dense buildup of severe pounding headache with associated light noise sensitivity, nauseous, lasting 4 to 5 hours, if she take Excedrin Migraine earlier, it can help her migraine, sleep generally helps too.  Since beginning of 2021, she experienced increased frequency of headache, from her usual frequency of 1 to twice each months to 2-3 times each week, severe, she has been taking up to 5 tablets of Excedrin Migraine on a daily basis, she was seen by her primary care November 27, 2019, was started on Topamax 25 mg twice daily, she tolerated the medicine well, which has helped her migraine, she has been headache free for 2 weeks.  Before that, she never tried preventive medications, common trigger for her migraines are weather change, menstruation, stress, sleep deprivation, hungry, strong smells, bright light, certain seafood   REVIEW OF SYSTEMS: Full 14 system review of systems performed and notable only for as above All other review of systems were negative.  ALLERGIES: Allergies  Allergen Reactions  . Contrast Media [Iodinated Diagnostic Agents] Hives and  Itching  . Other Anaphylaxis    Bar -b -q sauce  . Penicillins Anaphylaxis    Swelling and hives  . Latex Itching    HOME MEDICATIONS: Current Outpatient Medications  Medication Sig Dispense Refill  . topiramate (TOPAMAX) 25 MG tablet Take 25 mg by mouth 2 (two) times daily.    . ondansetron (ZOFRAN ODT) 4 MG disintegrating tablet Take 1 tablet (4 mg total) by mouth every 8 (eight) hours as needed. 20 tablet 6  . rizatriptan (MAXALT-MLT) 5 MG disintegrating tablet Take 1 tablet (5 mg total) by mouth as needed. May repeat in 2 hours if needed 15 tablet 12  . topiramate (TOPAMAX) 50 MG tablet Take 1 tablet (50 mg total) by mouth 2 (two) times daily. 180 tablet 4   No current facility-administered medications for this visit.    PAST MEDICAL HISTORY: Past Medical History:  Diagnosis Date  . Migraine   . Pneumonia     PAST SURGICAL HISTORY: Past Surgical History:  Procedure Laterality Date  . NO PAST SURGERIES      FAMILY HISTORY: History reviewed. No pertinent family history.  SOCIAL HISTORY: Social History   Socioeconomic History  . Marital status: Single    Spouse name: Not on file  . Number of children: 0  . Years of education: 91  . Highest education level: Not on file  Occupational History  . Occupation: 18   Tobacco Use  . Smoking status: Never Smoker  . Smokeless tobacco: Never Used  Substance and Sexual Activity  . Alcohol  use: Yes    Comment: socially  . Drug use: No  . Sexual activity: Not on file  Other Topics Concern  . Not on file  Social History Narrative   Right handed    Caffeine use: coffee every morning   Lives with mother   Social Determinants of Health   Financial Resource Strain:   . Difficulty of Paying Living Expenses:   Food Insecurity:   . Worried About Charity fundraiser in the Last Year:   . Arboriculturist in the Last Year:   Transportation Needs:   . Film/video editor (Medical):   [name] Lack of  Transportation (Non-Medical):   Physical Activity:   . Days of Exercise per Week:   . Minutes of Exercise per Session:   Stress:   . Feeling of Stress :   Social Connections:   . Frequency of Communication with Friends and Family:   . Frequency of Social Gatherings with Friends and Family:   . Attends Religious Services:   . Active Member of Clubs or Organizations:   . Attends Archivist Meetings:   [name] Marital Status:   Intimate Partner Violence:   . Fear of Current or Ex-Partner:   . Emotionally Abused:   [name] Physically Abused:   . Sexually Abused:      PHYSICAL EXAM   Vitals:   01/12/20 0729  BP: (!) 145/92  Pulse: 77  Temp: (!) 97.5 F (36.4 C)  Weight: 202 lb 12.8 oz (92 kg)  Height: 5\' 3"  (1.6 m)    Not recorded      Body mass index is 35.92 kg/m.  PHYSICAL EXAMNIATION:  Gen: NAD, conversant, well nourised, well groomed                     Cardiovascular: Regular rate rhythm, no peripheral edema, warm, nontender. Eyes: Conjunctivae clear without exudates or hemorrhage Neck: Supple, no carotid bruits. Pulmonary: Clear to auscultation bilaterally   NEUROLOGICAL EXAM:  MENTAL STATUS: Speech:    Speech is normal; fluent and spontaneous with normal comprehension.  Cognition:     Orientation to time, place and person     Normal recent and remote memory     Normal Attention span and concentration     Normal Language, naming, repeating,spontaneous speech     Fund of knowledge   CRANIAL NERVES: CN II: Visual fields are full to confrontation. Pupils are round equal and briskly reactive to light. CN III, IV, VI: extraocular movement are normal. No ptosis. CN V: Facial sensation is intact to light touch CN VII: Face is symmetric with normal eye closure  CN VIII: Hearing is normal to causal conversation. CN IX, X: Phonation is normal. CN XI: Head turning and shoulder shrug are intact  MOTOR: There is no pronator drift of out-stretched arms. Muscle  bulk and tone are normal. Muscle strength is normal.  REFLEXES: Reflexes are 2+ and symmetric at the biceps, triceps, knees, and ankles. Plantar responses are flexor.  SENSORY: Intact to light touch, pinprick and vibratory sensation are intact in fingers and toes.  COORDINATION: There is no trunk or limb dysmetria noted.  GAIT/STANCE: Posture is normal. Gait is steady with normal steps, base, arm swing, and turning.     DIAGNOSTIC DATA (LABS, IMAGING, TESTING) - I reviewed patient records, labs, notes, testing and imaging myself where available.   ASSESSMENT AND PLAN  Emilya Justen is a 34 y.o. female   Chronic migraine  headache  Titrating preventive medication Topamax to 50 mg twice a day  Maxalt 5 mg as needed, may combine it with Aleve, Zofran as needed  Return to clinic with Maralyn Sago in 4 months   Levert Feinstein, M.D. Ph.D.  [facility] [address] [place], [place] [address] Ph: [phone] Fax: [phone]  CC: Referring Provider

## 2020-05-13 ENCOUNTER — Ambulatory Visit: Payer: 59 | Admitting: Neurology

## 2020-05-13 ENCOUNTER — Encounter: Payer: Self-pay | Admitting: Neurology

## 2020-05-13 VITALS — BP 117/71 | HR 67 | Ht 62.0 in | Wt 189.0 lb

## 2020-05-13 DIAGNOSIS — IMO0002 Reserved for concepts with insufficient information to code with codable children: Secondary | ICD-10-CM

## 2020-05-13 DIAGNOSIS — G43709 Chronic migraine without aura, not intractable, without status migrainosus: Secondary | ICD-10-CM | POA: Diagnosis not present

## 2020-05-13 MED ORDER — TOPIRAMATE 50 MG PO TABS: 75.0000 mg | ORAL_TABLET | Freq: Two times a day (BID) | ORAL | 5 refills | Status: AC

## 2020-05-13 NOTE — Progress Notes (Signed)
PATIENT: Alexandra Shannon DOB: 08/25/1986  REASON FOR VISIT: follow up HISTORY FROM: patient  HISTORY OF PRESENT ILLNESS: Today 05/13/20  HISTORY  Alexandra Shannon is a 34 year old female, seen in request by her primary care physician Dr. Orvilla Cornwall for evaluation of chronic migraine headache, initial evaluation was on January 12, 2020.  I have reviewed and summarized the referring note from the referring physician.  She reported long history of migraine since teenager, her typical migraine are lateralized, usually on the left side, preceded by left eye twitching, dense buildup of severe pounding headache with associated light noise sensitivity, nauseous, lasting 4 to 5 hours, if she take Excedrin Migraine earlier, it can help her migraine, sleep generally helps too.  Since beginning of 2021, she experienced increased frequency of headache, from her usual frequency of 1 to twice each months to 2-3 times each week, severe, she has been taking up to 5 tablets of Excedrin Migraine on a daily basis, she was seen by her primary care November 27, 2019, was started on Topamax 25 mg twice daily, she tolerated the medicine well, which has helped her migraine, she has been headache free for 2 weeks.  Before that, she never tried preventive medications, common trigger for her migraines are weather change, menstruation, stress, sleep deprivation, hungry, strong smells, bright light, certain seafood  Update May 13, 2020 SS: When last seen Topamax was increased to 50 mg twice a day, given Maxalt PRN for acute headache. Headaches had essentially disappeared, but diagnosed with Covid a few weeks ago, with her illness, migraines returned, were typical migraine pattern, no new features. Developed some edema following prednisone, on a short course of HCTZ. Was vaccinated before COVID, treated in the home for Covid symptoms. Has never taken Maxalt, forgot she had it. 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  REVIEW OF SYSTEMS: Out of a complete 14 system review of symptoms, the patient complains only of the following symptoms, and all other reviewed systems are negative.  Headache  ALLERGIES: Allergies  Allergen Reactions  . Contrast Media [Iodinated Diagnostic Agents] Hives and Itching  . Other Anaphylaxis    Bar -b -q sauce  . Penicillins Anaphylaxis    Swelling and hives  . Latex Itching    HOME MEDICATIONS: Outpatient Medications Prior to Visit  Medication Sig Dispense Refill  . ondansetron (ZOFRAN ODT) 4 MG disintegrating tablet Take 1 tablet (4 mg total) by mouth every 8 (eight) hours as needed. 20 tablet 6  . rizatriptan (MAXALT-MLT) 5 MG disintegrating tablet Take 1 tablet (5 mg total) by mouth as needed. May repeat in 2 hours if needed 15 tablet 12  . topiramate (TOPAMAX) 25 MG tablet Take 25 mg by mouth 2 (two) times daily.    [name] topiramate (TOPAMAX) 50 MG tablet Take 1 tablet (50 mg total) by mouth 2 (two) times daily. 180 tablet 4   No facility-administered medications prior to visit.    PAST MEDICAL HISTORY: Past Medical History:  Diagnosis Date  . Migraine   . Pneumonia     PAST SURGICAL HISTORY: Past Surgical History:  Procedure Laterality Date  . NO PAST SURGERIES      FAMILY HISTORY: No family history on file.  SOCIAL HISTORY: Social History   Socioeconomic History  . Marital status: Single    Spouse name: Not on file  . Number of children: 0  . Years of education: 60  . Highest education level: Not on file  Occupational  History  . Occupation: Development worker, community   Tobacco Use  . Smoking status: Never Smoker  . Smokeless tobacco: Never Used  Vaping Use  . Vaping Use: Never used  Substance and Sexual Activity  . Alcohol use: Yes    Comment: socially  . Drug use: No  . Sexual activity: Not on file  Other Topics Concern  . Not on file  Social History Narrative   Right handed    Caffeine use: coffee every morning   Lives with mother    Social Determinants of Health   Financial Resource Strain:   . Difficulty of Paying Living Expenses: Not on file  Food Insecurity:   . Worried About Programme researcher, broadcasting/film/video in the Last Year: Not on file  . Ran Out of Food in the Last Year: Not on file  Transportation Needs:   . Lack of Transportation (Medical): Not on file  . Lack of Transportation (Non-Medical): Not on file  Physical Activity:   . Days of Exercise per Week: Not on file  . Minutes of Exercise per Session: Not on file  Stress:   . Feeling of Stress : Not on file  Social Connections:   . Frequency of Communication with Friends and Family: Not on file  . Frequency of Social Gatherings with Friends and Family: Not on file  . Attends Religious Services: Not on file  . Active Member of Clubs or Organizations: Not on file  . Attends Banker Meetings: Not on file  . Marital Status: Not on file  Intimate Partner Violence:   . Fear of Current or Ex-Partner: Not on file  . Emotionally Abused: Not on file  . Physically Abused: Not on file  . Sexually Abused: Not on file   PHYSICAL EXAM  Vitals:   05/13/20 1326  BP: 117/71  Pulse: 67  Weight: 189 lb (85.7 kg)  Height: 5\' 2"  (1.575 m)   Body mass index is 34.57 kg/m.  Generalized: Well developed, in no acute distress   Neurological examination  Mentation: Alert oriented to time, place, history taking. Follows all commands speech and language fluent Cranial nerve II-XII: Pupils were equal round reactive to light. Extraocular movements were full, visual field were full on confrontational test. Facial sensation and strength were normal. Head turning and shoulder shrug  were normal and symmetric. Motor: The motor testing reveals 5 over 5 strength of all 4 extremities. Good symmetric motor tone is noted throughout.  Sensory: Sensory testing is intact to soft touch on all 4 extremities. No evidence of extinction is noted.  Coordination: Cerebellar testing  reveals good finger-nose-finger and heel-to-shin bilaterally.  Gait and station: Gait is normal. Tandem gait is normal.   Reflexes: Deep tendon reflexes are symmetric and normal bilaterally.   DIAGNOSTIC DATA (LABS, IMAGING, TESTING) - I reviewed patient records, labs, notes, testing and imaging myself where available.  Lab Results  Component Value Date   WBC 14.3 (H) 11/30/2014   HGB 13.6 01/16/2016   HCT 40.0 01/16/2016   MCV 91.0 11/30/2014   PLT 319 11/30/2014      Component Value Date/Time   NA 139 01/16/2016 0753   K 3.6 01/16/2016 0753   CL 102 01/16/2016 0753   CO2 25 11/30/2014 0944   GLUCOSE 84 01/16/2016 0753   BUN 10 01/16/2016 0753   CREATININE 0.80 01/16/2016 0753   CALCIUM 9.1 11/30/2014 0944   GFRNONAA >90 11/30/2014 0944   GFRAA >90 11/30/2014 0944   No results  found for: CHOL, HDL, LDLCALC, LDLDIRECT, TRIG, CHOLHDL No results found for: GDJM4Q No results found for: VITAMINB12 No results found for: TSH  ASSESSMENT AND PLAN 34 y.o. year old female  has a past medical history of Migraine and Pneumonia. here with:  1.  Chronic migraine headaches  -Previously well controlled, recurrence following recent Covid illness, headaches post-covid her typical migraine presentation  -Increase Topamax 75 mg twice daily  -Continue Maxalt PRN for acute headaches   -Follow-up in 6 months or sooner if needed  I spent 20 minutes of face-to-face and non-face-to-face time with patient.  This included previsit chart review, lab review, study review, order entry, electronic health record documentation, patient education.  Margie Ege, AGNP-C, DNP 05/13/2020, 2:01 PM [facility] [address] [place], [place] [address] [phone]

## 2020-05-13 NOTE — Patient Instructions (Signed)
Increase Topamax 75 mg twice daily for headaches  If headaches do not improve overtime, let us know See you back in 6 months

## 2020-07-20 NOTE — Progress Notes (Signed)
I have reviewed and agreed above plan. 

## 2020-11-15 ENCOUNTER — Ambulatory Visit: Payer: 59 | Admitting: Neurology

## 2020-11-15 NOTE — Progress Notes (Deleted)
PATIENT: Alexandra Shannon DOB: [birth year]  REASON FOR VISIT: follow up HISTORY FROM: patient  HISTORY OF PRESENT ILLNESS: Today 11/15/20  HISTORY  Alexandra Shannon is a 35 year old female, seen in request by her primary care physician Dr. Orvilla Cornwall for evaluation of chronic migraine headache, initial evaluation was on January 12, 2020.  I have reviewed and summarized the referring note from the referring physician.  She reported long history of migraine since teenager, her typical migraine are lateralized, usually on the left side, preceded by left eye twitching, dense buildup of severe pounding headache with associated light noise sensitivity, nauseous, lasting 4 to 5 hours, if she take Excedrin Migraine earlier, it can help her migraine, sleep generally helps too.  Since beginning of 2021, she experienced increased frequency of headache, from her usual frequency of 1 to twice each months to 2-3 times each week, severe, she has been taking up to 5 tablets of Excedrin Migraine on a daily basis, she was seen by her primary care November 27, 2019, was started on Topamax 25 mg twice daily, she tolerated the medicine well, which has helped her migraine, she has been headache free for 2 weeks.  Before that, she never tried preventive medications, common trigger for her migraines are weather change, menstruation, stress, sleep deprivation, hungry, strong smells, bright light, certain seafood  Update May 13, 2020 SS: When last seen Topamax was increased to 50 mg twice a day, given Maxalt PRN for acute headache. Headaches had essentially disappeared, but diagnosed with Covid a few weeks ago, with her illness, migraines returned, were typical migraine pattern, no new features. Developed some edema following prednisone, on a short course of HCTZ. Was vaccinated before COVID, treated in the home for Covid symptoms. Has never taken Maxalt, forgot she had it. 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  Update November 15, 2020 SS:   REVIEW OF SYSTEMS: Out of a complete 14 system review of symptoms, the patient complains only of the following symptoms, and all other reviewed systems are negative.  Headache  ALLERGIES: Allergies  Allergen Reactions  . Contrast Media [Iodinated Diagnostic Agents] Hives and Itching  . Other Anaphylaxis    Bar -b -q sauce  . Penicillins Anaphylaxis    Swelling and hives  . Latex Itching    HOME MEDICATIONS: Outpatient Medications Prior to Visit  Medication Sig Dispense Refill  . ondansetron (ZOFRAN ODT) 4 MG disintegrating tablet Take 1 tablet (4 mg total) by mouth every 8 (eight) hours as needed. 20 tablet 6  . rizatriptan (MAXALT-MLT) 5 MG disintegrating tablet Take 1 tablet (5 mg total) by mouth as needed. May repeat in 2 hours if needed 15 tablet 12  . topiramate (TOPAMAX) 50 MG tablet Take 1.5 tablets (75 mg total) by mouth 2 (two) times daily. 90 tablet 5   No facility-administered medications prior to visit.    PAST MEDICAL HISTORY: Past Medical History:  Diagnosis Date  . Migraine   . Pneumonia     PAST SURGICAL HISTORY: Past Surgical History:  Procedure Laterality Date  . NO PAST SURGERIES      FAMILY HISTORY: No family history on file.  SOCIAL HISTORY: Social History   Socioeconomic History  . Marital status: Single    Spouse name: Not on file  . Number of children: 0  . Years of education: 5  . Highest education level: Not on file  Occupational History  . Occupation: Development worker, community   Tobacco Use  .  Smoking status: Never Smoker  . Smokeless tobacco: Never Used  Vaping Use  . Vaping Use: Never used  Substance and Sexual Activity  . Alcohol use: Yes    Comment: socially  . Drug use: No  . Sexual activity: Not on file  Other Topics Concern  . Not on file  Social History Narrative   Right handed    Caffeine use: coffee every morning   Lives with mother   Social Determinants of Health    Financial Resource Strain: Not on file  Food Insecurity: Not on file  Transportation Needs: Not on file  Physical Activity: Not on file  Stress: Not on file  Social Connections: Not on file  Intimate Partner Violence: Not on file   PHYSICAL EXAM  There were no vitals filed for this visit. There is no height or weight on file to calculate BMI.  Generalized: Well developed, in no acute distress   Neurological examination  Mentation: Alert oriented to time, place, history taking. Follows all commands speech and language fluent Cranial nerve II-XII: Pupils were equal round reactive to light. Extraocular movements were full, visual field were full on confrontational test. Facial sensation and strength were normal. Head turning and shoulder shrug  were normal and symmetric. Motor: The motor testing reveals 5 over 5 strength of all 4 extremities. Good symmetric motor tone is noted throughout.  Sensory: Sensory testing is intact to soft touch on all 4 extremities. No evidence of extinction is noted.  Coordination: Cerebellar testing reveals good finger-nose-finger and heel-to-shin bilaterally.  Gait and station: Gait is normal. Tandem gait is normal.   Reflexes: Deep tendon reflexes are symmetric and normal bilaterally.   DIAGNOSTIC DATA (LABS, IMAGING, TESTING) - I reviewed patient records, labs, notes, testing and imaging myself where available.  Lab Results  Component Value Date   WBC 14.3 (H) 11/30/2014   HGB 13.6 01/16/2016   HCT 40.0 01/16/2016   MCV 91.0 11/30/2014   PLT 319 11/30/2014      Component Value Date/Time   NA 139 01/16/2016 0753   K 3.6 01/16/2016 0753   CL 102 01/16/2016 0753   CO2 25 11/30/2014 0944   GLUCOSE 84 01/16/2016 0753   BUN 10 01/16/2016 0753   CREATININE 0.80 01/16/2016 0753   CALCIUM 9.1 11/30/2014 0944   GFRNONAA >90 11/30/2014 0944   GFRAA >90 11/30/2014 0944   No results found for: CHOL, HDL, LDLCALC, LDLDIRECT, TRIG, CHOLHDL No results  found for: HGBA1C No results found for: VITAMINB12 No results found for: TSH  ASSESSMENT AND PLAN 35 y.o. year old female  has a past medical history of Migraine and Pneumonia. here with:  1.  Chronic migraine headaches  -Previously well controlled, recurrence following recent Covid illness, headaches post-covid her typical migraine presentation  -Increase Topamax 75 mg twice daily  -Continue Maxalt PRN for acute headaches   -Follow-up in 6 months or sooner if needed  I spent 20 minutes of face-to-face and non-face-to-face time with patient.  This included previsit chart review, lab review, study review, order entry, electronic health record documentation, patient education.  Margie Ege, AGNP-C, DNP 11/15/2020, 5:50 AM [facility] [address] [place], [place] [address] [phone]

## 2023-01-17 DIAGNOSIS — Z0000 Encounter for general adult medical examination without abnormal findings: Secondary | ICD-10-CM | POA: Diagnosis not present

## 2023-01-17 DIAGNOSIS — E559 Vitamin D deficiency, unspecified: Secondary | ICD-10-CM | POA: Diagnosis not present

## 2023-01-17 DIAGNOSIS — Z01419 Encounter for gynecological examination (general) (routine) without abnormal findings: Secondary | ICD-10-CM | POA: Diagnosis not present

## 2023-01-22 DIAGNOSIS — I10 Essential (primary) hypertension: Secondary | ICD-10-CM | POA: Diagnosis not present

## 2023-01-22 DIAGNOSIS — Z823 Family history of stroke: Secondary | ICD-10-CM | POA: Diagnosis not present

## 2023-01-22 DIAGNOSIS — G43909 Migraine, unspecified, not intractable, without status migrainosus: Secondary | ICD-10-CM | POA: Diagnosis not present

## 2023-01-22 DIAGNOSIS — Z91041 Radiographic dye allergy status: Secondary | ICD-10-CM | POA: Diagnosis not present

## 2023-01-22 DIAGNOSIS — Z6833 Body mass index (BMI) 33.0-33.9, adult: Secondary | ICD-10-CM | POA: Diagnosis not present

## 2023-01-22 DIAGNOSIS — E282 Polycystic ovarian syndrome: Secondary | ICD-10-CM | POA: Diagnosis not present

## 2023-01-22 DIAGNOSIS — Z8249 Family history of ischemic heart disease and other diseases of the circulatory system: Secondary | ICD-10-CM | POA: Diagnosis not present

## 2023-01-22 DIAGNOSIS — Z79811 Long term (current) use of aromatase inhibitors: Secondary | ICD-10-CM | POA: Diagnosis not present

## 2023-01-22 DIAGNOSIS — Z833 Family history of diabetes mellitus: Secondary | ICD-10-CM | POA: Diagnosis not present

## 2023-01-22 DIAGNOSIS — E669 Obesity, unspecified: Secondary | ICD-10-CM | POA: Diagnosis not present

## 2023-01-22 DIAGNOSIS — Z7984 Long term (current) use of oral hypoglycemic drugs: Secondary | ICD-10-CM | POA: Diagnosis not present

## 2023-01-22 DIAGNOSIS — Z883 Allergy status to other anti-infective agents status: Secondary | ICD-10-CM | POA: Diagnosis not present

## 2023-04-25 DIAGNOSIS — U071 COVID-19: Secondary | ICD-10-CM | POA: Diagnosis not present

## 2023-04-26 DIAGNOSIS — R0981 Nasal congestion: Secondary | ICD-10-CM | POA: Diagnosis not present

## 2023-04-26 DIAGNOSIS — U071 COVID-19: Secondary | ICD-10-CM | POA: Diagnosis not present

## 2023-09-10 DIAGNOSIS — Z7251 High risk heterosexual behavior: Secondary | ICD-10-CM | POA: Diagnosis not present

## 2023-10-04 DIAGNOSIS — R0981 Nasal congestion: Secondary | ICD-10-CM | POA: Diagnosis not present

## 2023-10-04 DIAGNOSIS — J320 Chronic maxillary sinusitis: Secondary | ICD-10-CM | POA: Diagnosis not present

## 2024-01-02 ENCOUNTER — Other Ambulatory Visit: Payer: Self-pay | Admitting: Allergy and Immunology

## 2024-01-02 ENCOUNTER — Ambulatory Visit
Admission: RE | Admit: 2024-01-02 | Discharge: 2024-01-02 | Disposition: A | Discharge status: Home / Self Care | Source: Ambulatory Visit | Attending: Allergy and Immunology | Admitting: Allergy and Immunology

## 2024-01-02 DIAGNOSIS — R0602 Shortness of breath: Secondary | ICD-10-CM

## 2024-03-13 ENCOUNTER — Other Ambulatory Visit: Payer: Self-pay

## 2024-03-13 ENCOUNTER — Encounter (HOSPITAL_COMMUNITY): Payer: Self-pay | Admitting: Emergency Medicine

## 2024-03-13 ENCOUNTER — Emergency Department (HOSPITAL_COMMUNITY)

## 2024-03-13 ENCOUNTER — Emergency Department (HOSPITAL_COMMUNITY)
Admission: EM | Admit: 2024-03-13 | Discharge: 2024-03-13 | Disposition: A | Discharge status: Home / Self Care | Attending: Emergency Medicine | Admitting: Emergency Medicine

## 2024-03-13 DIAGNOSIS — S8991XA Unspecified injury of right lower leg, initial encounter: Secondary | ICD-10-CM | POA: Insufficient documentation

## 2024-03-13 DIAGNOSIS — S299XXA Unspecified injury of thorax, initial encounter: Secondary | ICD-10-CM | POA: Insufficient documentation

## 2024-03-13 DIAGNOSIS — Z91040 Latex allergy status: Secondary | ICD-10-CM | POA: Diagnosis not present

## 2024-03-13 DIAGNOSIS — S8992XA Unspecified injury of left lower leg, initial encounter: Secondary | ICD-10-CM | POA: Insufficient documentation

## 2024-03-13 DIAGNOSIS — M7918 Myalgia, other site: Secondary | ICD-10-CM

## 2024-03-13 DIAGNOSIS — Y92410 Unspecified street and highway as the place of occurrence of the external cause: Secondary | ICD-10-CM | POA: Diagnosis not present

## 2024-03-13 DIAGNOSIS — R079 Chest pain, unspecified: Secondary | ICD-10-CM | POA: Diagnosis present

## 2024-03-13 DIAGNOSIS — S99921A Unspecified injury of right foot, initial encounter: Secondary | ICD-10-CM | POA: Insufficient documentation

## 2024-03-13 LAB — PREGNANCY, URINE: Preg Test, Ur: NEGATIVE
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
